# Patient Record
Sex: Female | Born: 1960 | Race: White | Hispanic: No | Marital: Married | Smoking: Former smoker
Health system: Southern US, Community
[De-identification: ages and names within clinical notes are randomized; demographics above are authoritative.]

## PROBLEM LIST (undated history)

## (undated) DIAGNOSIS — R519 Headache, unspecified: Secondary | ICD-10-CM

## (undated) DIAGNOSIS — I10 Essential (primary) hypertension: Secondary | ICD-10-CM

## (undated) DIAGNOSIS — E785 Hyperlipidemia, unspecified: Secondary | ICD-10-CM

## (undated) DIAGNOSIS — G250 Essential tremor: Secondary | ICD-10-CM

## (undated) DIAGNOSIS — F419 Anxiety disorder, unspecified: Secondary | ICD-10-CM

## (undated) DIAGNOSIS — R51 Headache: Secondary | ICD-10-CM

## (undated) HISTORY — PX: DILATION AND CURETTAGE OF UTERUS: SHX78

---

## 2004-10-09 ENCOUNTER — Ambulatory Visit: Payer: Self-pay | Admitting: Internal Medicine

## 2004-10-21 ENCOUNTER — Ambulatory Visit: Payer: Self-pay | Admitting: Internal Medicine

## 2005-10-29 ENCOUNTER — Ambulatory Visit: Payer: Self-pay | Admitting: Internal Medicine

## 2006-11-18 ENCOUNTER — Ambulatory Visit: Payer: Self-pay | Admitting: Internal Medicine

## 2007-01-14 ENCOUNTER — Ambulatory Visit: Payer: Self-pay | Admitting: Internal Medicine

## 2007-04-27 ENCOUNTER — Ambulatory Visit: Payer: Self-pay | Admitting: Internal Medicine

## 2007-10-20 ENCOUNTER — Ambulatory Visit: Payer: Self-pay | Admitting: Internal Medicine

## 2008-05-16 ENCOUNTER — Ambulatory Visit: Payer: Self-pay | Admitting: Family Medicine

## 2008-06-14 ENCOUNTER — Ambulatory Visit: Payer: Self-pay | Admitting: Internal Medicine

## 2008-07-31 ENCOUNTER — Ambulatory Visit: Payer: Self-pay | Admitting: Internal Medicine

## 2009-01-29 ENCOUNTER — Ambulatory Visit: Payer: Self-pay | Admitting: Internal Medicine

## 2009-08-06 ENCOUNTER — Ambulatory Visit: Payer: Self-pay | Admitting: Internal Medicine

## 2011-01-23 ENCOUNTER — Ambulatory Visit: Payer: Self-pay | Admitting: Internal Medicine

## 2011-10-21 ENCOUNTER — Ambulatory Visit: Payer: Self-pay | Admitting: Nurse Practitioner

## 2011-11-05 ENCOUNTER — Inpatient Hospital Stay: Payer: Self-pay | Admitting: Psychiatry

## 2011-11-05 LAB — URINALYSIS, COMPLETE
Glucose,UR: NEGATIVE mg/dL (ref 0–75)
Leukocyte Esterase: NEGATIVE
Nitrite: NEGATIVE
Ph: 7 (ref 4.5–8.0)
Protein: NEGATIVE
Specific Gravity: 1.003 (ref 1.003–1.030)

## 2011-11-05 LAB — COMPREHENSIVE METABOLIC PANEL
Albumin: 4.5 g/dL (ref 3.4–5.0)
Alkaline Phosphatase: 94 U/L (ref 50–136)
Anion Gap: 10 (ref 7–16)
Calcium, Total: 8.7 mg/dL (ref 8.5–10.1)
Glucose: 118 mg/dL — ABNORMAL HIGH (ref 65–99)
SGOT(AST): 19 U/L (ref 15–37)
SGPT (ALT): 24 U/L (ref 12–78)

## 2011-11-05 LAB — ETHANOL: Ethanol: <3 mg/dL

## 2011-11-05 LAB — DRUG SCREEN, URINE
Amphetamines, Ur Screen: NEGATIVE (ref ?–1000)
Barbiturates, Ur Screen: NEGATIVE (ref ?–200)
Benzodiazepine, Ur Scrn: NEGATIVE (ref ?–200)
Methadone, Ur Screen: NEGATIVE (ref ?–300)
Tricyclic, Ur Screen: NEGATIVE (ref ?–1000)

## 2011-11-05 LAB — CBC
HGB: 16 g/dL (ref 12.0–16.0)
MCH: 32.5 pg (ref 26.0–34.0)
MCHC: 34.5 g/dL (ref 32.0–36.0)
MCV: 94 fL (ref 80–100)

## 2011-11-05 LAB — TSH: Thyroid Stimulating Horm: 2.09 u[IU]/mL

## 2013-05-06 ENCOUNTER — Ambulatory Visit: Payer: Self-pay | Admitting: Family Medicine

## 2013-07-05 ENCOUNTER — Ambulatory Visit: Payer: Self-pay | Admitting: Obstetrics and Gynecology

## 2013-07-05 LAB — COMPREHENSIVE METABOLIC PANEL
ALK PHOS: 122 U/L — AB
ALT: 26 U/L (ref 12–78)
Albumin: 4.1 g/dL (ref 3.4–5.0)
Anion Gap: 6 — ABNORMAL LOW (ref 7–16)
BILIRUBIN TOTAL: 0.6 mg/dL (ref 0.2–1.0)
BUN: 11 mg/dL (ref 7–18)
CALCIUM: 9.1 mg/dL (ref 8.5–10.1)
CO2: 31 mmol/L (ref 21–32)
CREATININE: 0.92 mg/dL (ref 0.60–1.30)
Chloride: 102 mmol/L (ref 98–107)
EGFR (African American): >60
EGFR (Non-African Amer.): >60
Glucose: 100 mg/dL — ABNORMAL HIGH (ref 65–99)
OSMOLALITY: 277 (ref 275–301)
Potassium: 4.6 mmol/L (ref 3.5–5.1)
SGOT(AST): 16 U/L (ref 15–37)
Sodium: 139 mmol/L (ref 136–145)
Total Protein: 7.4 g/dL (ref 6.4–8.2)

## 2013-07-05 LAB — CBC
HCT: 45.2 % (ref 35.0–47.0)
HGB: 15.3 g/dL (ref 12.0–16.0)
MCH: 32.1 pg (ref 26.0–34.0)
MCHC: 33.9 g/dL (ref 32.0–36.0)
MCV: 95 fL (ref 80–100)
PLATELETS: 216 10*3/uL (ref 150–440)
RBC: 4.78 10*6/uL (ref 3.80–5.20)
RDW: 14.1 % (ref 11.5–14.5)
WBC: 8.6 10*3/uL (ref 3.6–11.0)

## 2013-07-14 ENCOUNTER — Ambulatory Visit: Payer: Self-pay | Admitting: Obstetrics and Gynecology

## 2013-07-15 LAB — PATHOLOGY REPORT

## 2014-04-25 NOTE — H&P (Signed)
PATIENT NAME:  Whitney Huffman, Whitney Huffman MR#:  [id number] DATE OF BIRTH:  Mar 18, 1960  DATE OF ADMISSION:  11/05/2011  REFERRING PHYSICIAN: Conni Slipper, MD    ADMITTING PHYSICIAN: Cephus Shelling, MD   REASON FOR ADMISSION: Severe anxiety and panic disorder, not feeling safe.   IDENTIFYING INFORMATION: Ms. Whitney Huffman is a 54 year old married Caucasian female currently living in the [place] area with her husband of 20 years and two children. 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   HISTORY OF PRESENT ILLNESS: Ms. Whitney Huffman is a 54 year old married Caucasian female with a history of recurrent depression and panic disorder for the past 12 years that first started after her dad passed away. She was referred to the Emergency Room by her PCP, Dr. Clayborn Bigness, secondary to the fact that the patient was experiencing worsening problems with anxiety and panic attacks and not feeling safe to return home. The patient says that about four weeks ago she had a really bad panic attack after she dropped her daughter off at the dentist. She cannot state exactly what precipitated the attack but says that since that time she has been having 6 to 8 small panic attacks lasting 2 to 3 minutes each time on an almost daily basis. She says the panic attacks have gotten so severe that she has not been able to function at work and has difficulty with shortness of breath, heart palpitations and feeling like there is something on her chest. She says she has a lot of fear between the attacks as well. The patient has been on Celexa 60 mg for the past 12 years but four weeks ago due to increasing anxiety and feeling like the Celexa was not working she was titrated off of Celexa and started on Effexor. She is currently taking 75 mg of Effexor but that was only increased from 37.5 mg just two days ago. The patient says that she does not feel like she has had any improvement on the Effexor and feels jittery and nervous all  the time. She cannot identify any specific triggers for the worsening anxiety or for the panic attacks. She says only thinking about future panic attacks is what brings them on. She says her husband's job is a little stressful currently but she does not think that is contributing. She does endorse feelings of hopelessness and helplessness, frequent crying spells, difficulty with focus and concentration, low energy level, and decreased appetite. She has lost 10 pounds within the past one month. She denies any insomnia but says that when she wakes up in the morning she is in a full-blown panic. She denies any auditory or visual hallucinations. No paranoid thoughts or delusions. The patient says that during the day she is pacing the floors a lot due to restlessness, anxiety, and feels like she has "bad thoughts" that another attack is going to occur. When she came to the hospital today accompanied by her husband, the patient says that she does not feel comfortable going home until her anxiety had decreased. In addition, she has also been having difficulty with elevated blood pressures with a systolic pressure of 102 and diastolic pressure of 725 when she was in the Emergency Room. She was started on an antihypertensive about two weeks ago secondary to elevated blood pressures at home. The patient denies any auditory or visual hallucinations. She denies any paranoid thoughts or delusions. She denies any history of symptoms consistent with bipolar mania including grandiose delusions, decreased sleep for several  days at a time with increased goal directed behavior, increased energy, hyperreligious thoughts, or hypersexual behavior.   PAST PSYCHIATRIC HISTORY: The patient denies any prior inpatient psychiatric hospitalizations or suicide attempts. The patient was seen by Dr. Thurmond Butts twice as an outpatient 12 years ago after her father passed. She has not been seeing a psychiatrist and has been getting her antidepressants  from her PCP since. She has had a trial of Prozac in the past and Celexa 60 mg which she was on for 12 years. She was recently given a Xanax prescription of 0.25 mg at bedtime by her PCP but has not had any improvement on the medication.   SUBSTANCE ABUSE HISTORY: The patient denies any history of any alcohol, cocaine, cannabis, opiate, or stimulant use. She does smoke a half to 1 pack of cigarettes per day and has been smoking since her teens.   FAMILY PSYCHIATRIC HISTORY: The patient denies any history of any mental illness or substance use in the family.   PAST MEDICAL HISTORY:  1. Hypertension.  2. Hyperlipidemia.  3. History of tonsillectomy.  4. She denies any history of any prior TBI or seizures.   OUTPATIENT MEDICATIONS:  1. Toprol-XL 25 mg p.o. daily. 2. Effexor XR 75 mg p.o. daily. 3. Xanax 0.25 mg p.o. nightly.   ALLERGIES: No known drug allergies.   SOCIAL HISTORY: The patient was born and raised in [place] by both her biological parents until her father passed away. She does report having a good relationship with her mother and her mother is still living in the area. She denies any history of any physical or sexual abuse. She has a 12th grade education and never went to college. 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  LIVING SITUATION: She currently lives with her husband and two children, a 60 year old and a 83 year old. The 52 year old apparently just moved back into the house for a few days and is planning to move into a house in their neighborhood. The 69 year old daughter is in college in [place]. She denies any significant marital conflict.   LEGAL HISTORY: She denies any history of any arrests or incarcerations.   MENTAL STATUS EXAM: Whitney Huffman is a 54 year old obese Caucasian female who was wearing burgundy scrub pants and a lime green shirt. She was fully alert and oriented to time, place, and situation. Speech was regular rate  and rhythm, fluent and coherent. Mood was described as being depressed and affect was extremely anxious. The patient was sad and tearful. She was shaking in all four extremities and her teeth were chattering. Her husband was at the bedside. She denied any current suicidal or homicidal thoughts. She denied any current auditory or visual hallucinations. She denied any paranoid thoughts or delusions. Judgment and insight appeared to be fairly good. Attention and concentration were fairly good. Recall was 3 out of 3 initially and 3 out of 3 after five minutes. The patient was able to name the presidents backwards to Rayne, Sr. She had difficulty with serial sevens after 93. She was able to spell world backwards correctly. Abstraction was good.   SUICIDE RISK ASSESSMENT: At this time Whitney Huffman remains at a fairly low to moderate risk of harm to self and others but risk will be elevated if the patient continues to experience severe anxiety and is not able to function at home. She denies any access to guns.   REVIEW OF SYSTEMS: CONSTITUTIONAL: She denies any weakness. Does complain of some  fatigue and decreased appetite with a 10 pound weight loss in the past one month. She denies any fever, chills, or night sweats. HEAD: She does complain of a headache currently. She denies any dizziness. EYES: She denies any diplopia or blurred vision. ENT: She denies any hearing loss, neck pain, or throat pain. RESPIRATORY: She denies any shortness breath or cough. CARDIOVASCULAR: She denies any chest pain or orthopnea. GI: She denies any nausea, vomiting, or abdominal pain. She denies any change in bowel movements. GU: She denies incontinence or problems with frequency of urine. ENDOCRINE: She denies any heat or cold intolerance. LYMPHATIC: She denies anemia or easy bruising. MUSCULOSKELETAL: She denies any muscle or joint pain. NEUROLOGICAL: She denies any tingling or weakness. PSYCHIATRIC: Please see history of present illness.    PHYSICAL EXAMINATION:   VITAL SIGNS: Blood pressure 215/108, pulse 95, respirations 20, temperature 97.8.   HEENT: Normocephalic, atraumatic. Pupils equal, round, and reactive to light and accommodation. Extraocular movements intact. Oral mucosa was moist. No lesions noted.   NECK: Supple. No cervical lymphadenopathy or thyromegaly present.   LUNGS: Clear to auscultation bilaterally. No crackles, rales, or rhonchi.   CARDIAC: S1, S2, present. Regular rate and rhythm. No murmurs, rubs, or gallops.   ABDOMEN: Soft and normoactive bowel sounds present in all four quadrants. No masses noted. No tenderness noted. No distention.   EXTREMITIES: +2 pedal pulses bilaterally. No rashes, clubbing, or edema.   NEUROLOGIC: Cranial nerves II through XII grossly intact. Gait was normal and steady. Sensation intact.   LABORATORY, DIAGNOSTIC, AND RADIOLOGICAL DATA: Urine tox screen was negative for all substances. LFTs, TSH, BMP, and CBC were all within normal limits. Urinalysis is nitrite and leukocyte esterase negative with 1 WBC. Salicylates were 3.9. Acetaminophen level was less than 2. Glucose 118.   DIAGNOSES:  AXIS I:  1. Panic disorder. 2. Major depression, recurrent, moderate to severe.   AXIS II: Deferred.   AXIS III:  1. Hypertension.  2. Hyperlipidemia.   AXIS IV: Mild. Husband's occupational problems.   AXIS V: GAF at present equals 30.   ASSESSMENT AND TREATMENT RECOMMENDATIONS: Whitney Huffman is a 54 year old married Caucasian female with history of panic disorder and recurrent depression who was referred to the Emergency Room by her PCP due to the fact that she is unable to function in the community and has not been able to work secondary to severe anxiety and panic attacks. The patient is complaining of a lot of stress and feeling unsafe returning home due to anxiety and panic attacks. She is denying any suicidal thoughts but says that she does not know what she will do if she does  not get help. She denies any psychotic symptoms. Will admit to Inpatient Psychiatry for medication management, safety, and stabilization and place on close observation.  1. Panic disorder/major depressive disorder, recurrent. Will plan to increase Effexor-XR to 150 mg p.o. daily and add Lexapro 10 mg p.o. daily as well for anxiety and panic attacks. The patient did have a positive response to Celexa in the past but felt like it stopped working. Will check B12 level.  2. Hypertension. The patient has quite an elevated blood pressure in the Emergency Room at 215/108 but was given clonidine by the ER physician. Will restart metoprolol XL at 25 mg p.o. daily and increase antihypertensive if needed. Elevated blood pressure may be secondary to anxiety. The patient was also started on Klonopin 1 mg p.o. b.i.d. for severe anxiety and panic attacks.  3.  Disposition. The patient has a stable living situation. Will need to find mental health follow-up with a community provider. Risks, benefits, and alternatives to treatment were discussed with the patient and she consented to the treatment plan.   ____________________________ Steva Colder. Nicolasa Ducking, MD akk:drc D: 11/05/2011 13:53:16 ET T: 11/05/2011 14:15:57 ET JOB#: [id number]  cc: Gedalia Mcmillon K. Nicolasa Ducking, MD, <Dictator> Chauncey Mann MD ELECTRONICALLY SIGNED 11/06/2011 14:41

## 2014-04-29 NOTE — Op Note (Signed)
PATIENT NAME:  Whitney Huffman, Whitney Huffman MR#:  [id number] DATE OF BIRTH:  10-25-1960  DATE OF PROCEDURE:  07/14/2013  PREOPERATIVE DIAGNOSIS: Postmenopausal bleeding.  POSTOPERATIVE DIAGNOSIS: Postmenopausal bleeding.   PROCEDURES:   1.  Dilation and curettage.  2.  Hysteroscopy.   ANESTHESIA: General.   SURGEON: Prentice Docker, M.D.   ESTIMATED BLOOD LOSS: Minimal.   OPERATIVE FLUIDS: 500 mL crystalloid.   COMPLICATIONS: None.   FINDINGS: Normal-appearing uterine cavity without obvious lesions.   SPECIMENS: Endometrial curettings.   CONDITION AT THE END OF PROCEDURE: Stable.   PROCEDURE IN DETAIL: The patient was taken to the operating room where general anesthesia was administered and found to be adequate. She was placed in the dorsal supine high lithotomy position in candy cane stirrups and prepped and draped in the usual sterile fashion. After a timeout was called her bladder was emptied using straight catheterization. A sterile speculum was placed in the vagina and a single-tooth tenaculum was used to grasp the anterior lip of the cervix. The uterus was sounded to a depth of approximately 8 cm. The cervix was dilated gently in a serial fashion using Hegar dilators to a dilatation of 6 mm. MyoSure  hysteroscope was gently advanced through the cervix into the uterine cavity with the above-noted findings. With no obvious lesions needing to be removed with the MyoSure device, the hysteroscope was removed and a gentle curettage was performed for global sample throughout. At the end of the curettage the scope was reintroduced into the cervix to assure global sampling and hemostasis, both of which were noted.   The hysteroscope was removed and the single-tooth tenaculum was removed from the cervix with hemostasis noted. No other instrumentation was remaining in the vagina at this time as the speculum was being removed.   The patient tolerated the procedure well. Sponge, lap, and needle counts were  correct x2. For VTE prophylaxis, the patient was wearing pneumatic compression stockings which were on and operating throughout the entire procedure.    ____________________________ Will Bonnet, MD sdj:lt D: 07/14/2013 10:31:00 ET T: 07/14/2013 10:59:13 ET JOB#: [id number]  cc: Will Bonnet, MD, <Dictator> Will Bonnet MD ELECTRONICALLY SIGNED 07/27/2013 2:49

## 2015-02-26 ENCOUNTER — Ambulatory Visit
Admission: RE | Admit: 2015-02-26 | Discharge: 2015-02-26 | Disposition: A | Discharge status: Home / Self Care | Payer: 59 | Source: Ambulatory Visit | Attending: Family Medicine | Admitting: Family Medicine

## 2015-02-26 ENCOUNTER — Other Ambulatory Visit: Payer: Self-pay | Admitting: Family Medicine

## 2015-02-26 DIAGNOSIS — Z1231 Encounter for screening mammogram for malignant neoplasm of breast: Secondary | ICD-10-CM | POA: Insufficient documentation

## 2015-05-10 ENCOUNTER — Encounter: Payer: Self-pay | Admitting: *Deleted

## 2015-05-11 ENCOUNTER — Ambulatory Visit: Payer: 59 | Admitting: Anesthesiology

## 2015-05-11 ENCOUNTER — Encounter: Payer: Self-pay | Admitting: *Deleted

## 2015-05-11 ENCOUNTER — Encounter
Admission: RE | Disposition: A | Discharge status: Home / Self Care | Payer: Self-pay | Source: Ambulatory Visit | Attending: Unknown Physician Specialty

## 2015-05-11 ENCOUNTER — Ambulatory Visit
Admission: RE | Admit: 2015-05-11 | Discharge: 2015-05-11 | Disposition: A | Discharge status: Home / Self Care | Payer: 59 | Source: Ambulatory Visit | Attending: Unknown Physician Specialty | Admitting: Unknown Physician Specialty

## 2015-05-11 DIAGNOSIS — E785 Hyperlipidemia, unspecified: Secondary | ICD-10-CM | POA: Diagnosis not present

## 2015-05-11 DIAGNOSIS — K635 Polyp of colon: Secondary | ICD-10-CM | POA: Insufficient documentation

## 2015-05-11 DIAGNOSIS — D122 Benign neoplasm of ascending colon: Secondary | ICD-10-CM | POA: Insufficient documentation

## 2015-05-11 DIAGNOSIS — G250 Essential tremor: Secondary | ICD-10-CM | POA: Insufficient documentation

## 2015-05-11 DIAGNOSIS — D123 Benign neoplasm of transverse colon: Secondary | ICD-10-CM | POA: Insufficient documentation

## 2015-05-11 DIAGNOSIS — I10 Essential (primary) hypertension: Secondary | ICD-10-CM | POA: Diagnosis not present

## 2015-05-11 DIAGNOSIS — Z79899 Other long term (current) drug therapy: Secondary | ICD-10-CM | POA: Insufficient documentation

## 2015-05-11 DIAGNOSIS — Z803 Family history of malignant neoplasm of breast: Secondary | ICD-10-CM | POA: Diagnosis not present

## 2015-05-11 DIAGNOSIS — F419 Anxiety disorder, unspecified: Secondary | ICD-10-CM | POA: Diagnosis not present

## 2015-05-11 DIAGNOSIS — D125 Benign neoplasm of sigmoid colon: Secondary | ICD-10-CM | POA: Insufficient documentation

## 2015-05-11 DIAGNOSIS — Z9889 Other specified postprocedural states: Secondary | ICD-10-CM | POA: Diagnosis not present

## 2015-05-11 DIAGNOSIS — Z87891 Personal history of nicotine dependence: Secondary | ICD-10-CM | POA: Diagnosis not present

## 2015-05-11 DIAGNOSIS — Z1211 Encounter for screening for malignant neoplasm of colon: Secondary | ICD-10-CM | POA: Insufficient documentation

## 2015-05-11 DIAGNOSIS — D124 Benign neoplasm of descending colon: Secondary | ICD-10-CM | POA: Insufficient documentation

## 2015-05-11 HISTORY — PX: COLONOSCOPY WITH PROPOFOL: SHX5780

## 2015-05-11 HISTORY — DX: Anxiety disorder, unspecified: F41.9

## 2015-05-11 HISTORY — DX: Essential tremor: G25.0

## 2015-05-11 HISTORY — DX: Hyperlipidemia, unspecified: E78.5

## 2015-05-11 HISTORY — DX: Headache: R51

## 2015-05-11 HISTORY — DX: Essential (primary) hypertension: I10

## 2015-05-11 HISTORY — DX: Headache, unspecified: R51.9

## 2015-05-11 SURGERY — COLONOSCOPY WITH PROPOFOL
Anesthesia: General

## 2015-05-11 MED ORDER — LIDOCAINE HCL (PF) 2 % IJ SOLN
INTRAMUSCULAR | Status: DC | PRN
  Administered 2015-05-11: 50 mg

## 2015-05-11 MED ORDER — PROPOFOL 500 MG/50ML IV EMUL
INTRAVENOUS | Status: DC | PRN
Start: 2015-05-11 — End: 2015-05-11
  Administered 2015-05-11: 50 ug/kg/min via INTRAVENOUS

## 2015-05-11 MED ORDER — PROPOFOL 10 MG/ML IV BOLUS
INTRAVENOUS | Status: DC | PRN
  Administered 2015-05-11: 30 mg via INTRAVENOUS
  Administered 2015-05-11: 20 mg via INTRAVENOUS

## 2015-05-11 MED ORDER — FENTANYL CITRATE (PF) 100 MCG/2ML IJ SOLN
INTRAMUSCULAR | Status: DC | PRN
  Administered 2015-05-11 (×2): 50 ug via INTRAVENOUS

## 2015-05-11 MED ORDER — SODIUM CHLORIDE 0.9 % IV SOLN
INTRAVENOUS | Status: DC
  Administered 2015-05-11: 1000 mL via INTRAVENOUS

## 2015-05-11 MED ORDER — SODIUM CHLORIDE 0.9 % IV SOLN: INTRAVENOUS | Status: DC

## 2015-05-11 MED ORDER — MIDAZOLAM HCL 5 MG/5ML IJ SOLN
INTRAMUSCULAR | Status: DC | PRN
  Administered 2015-05-11 (×2): 1 mg via INTRAVENOUS

## 2015-05-11 NOTE — Transfer of Care (Signed)
Immediate Anesthesia Transfer of Care Note  Patient: Whitney Huffman  Procedure(s) Performed: Procedure(s): COLONOSCOPY WITH PROPOFOL (N/A)  Patient Location: PACU  Anesthesia Type:General  Level of Consciousness: awake, alert , oriented and patient cooperative  Airway & Oxygen Therapy: Patient Spontanous Breathing and Patient connected to nasal cannula oxygen  Post-op Assessment: Report given to RN, Post -op Vital signs reviewed and stable and Patient moving all extremities X 4  Post vital signs: Reviewed and stable  Last Vitals:  Filed Vitals:   05/11/15 1338  BP: 185/82  Pulse: 93  Temp: 36.7 C  Resp: 18    Last Pain: There were no vitals filed for this visit.       Complications: No apparent anesthesia complications

## 2015-05-11 NOTE — Anesthesia Preprocedure Evaluation (Signed)
Anesthesia Evaluation  Patient identified by MRN, date of birth, ID band Patient awake    Reviewed: Allergy & Precautions, H&P , NPO status , Patient's Chart, lab work & pertinent test results  History of Anesthesia Complications Negative for: history of anesthetic complications  Airway Mallampati: III  TM Distance: >3 FB Neck ROM: full    Dental  (+) Poor Dentition   Pulmonary neg shortness of breath, former smoker,    Pulmonary exam normal breath sounds clear to auscultation       Cardiovascular Exercise Tolerance: Good hypertension, (-) angina(-) Past MI and (-) DOE negative cardio ROS Normal cardiovascular exam Rhythm:regular Rate:Normal     Neuro/Psych  Headaches, PSYCHIATRIC DISORDERS Anxiety    GI/Hepatic negative GI ROS, Neg liver ROS, neg GERD  ,  Endo/Other  negative endocrine ROS  Renal/GU negative Renal ROS  negative genitourinary   Musculoskeletal   Abdominal   Peds  Hematology negative hematology ROS (+)   Anesthesia Other Findings Past Medical History:   Anxiety                                                      Hypertension                                                 Essential tremor                                             Headache                                                     Hyperlipidemia                                              Past Surgical History:   DILATION AND CURETTAGE OF UTERUS                             BMI    Body Mass Index   42.80 kg/m 2    Signs and symptoms suggestive of sleep apnea    Reproductive/Obstetrics negative OB ROS                             Anesthesia Physical Anesthesia Plan  ASA: III  Anesthesia Plan: General   Post-op Pain Management:    Induction:   Airway Management Planned:   Additional Equipment:   Intra-op Plan:   Post-operative Plan:   Informed Consent: I have reviewed the patients History  and Physical, chart, labs and discussed the procedure including the risks, benefits and alternatives for the proposed anesthesia with the patient or authorized representative who has indicated his/her understanding and acceptance.   Dental Advisory  Given  Plan Discussed with: Anesthesiologist, CRNA and Surgeon  Anesthesia Plan Comments:         Anesthesia Quick Evaluation

## 2015-05-11 NOTE — Op Note (Signed)
[facility] Gastroenterology Patient Name: Whitney Huffman Procedure Date: 05/11/2015 1:56 PM MRN: [id number] Account #: [id number] Date of Birth: Jan 24, 1960 Admit Type: Outpatient Age: 55 Room: [facility] ENDO ROOM 1 Gender: Female Note Status: Finalized Procedure:            Colonoscopy Indications:          Screening for colorectal malignant neoplasm Providers:            Manya Silvas, MD Referring MD:         Dani Gobble. White, MD (Referring MD) Medicines:            Propofol per Anesthesia Complications:        No immediate complications. Procedure:            Pre-Anesthesia Assessment:                       - After reviewing the risks and benefits, the patient                        was deemed in satisfactory condition to undergo the                        procedure.                       After obtaining informed consent, the colonoscope was                        passed under direct vision. Throughout the procedure,                        the patient's blood pressure, pulse, and oxygen                        saturations were monitored continuously. The                        Colonoscope was introduced through the anus and                        advanced to the the cecum, identified by appendiceal                        orifice and ileocecal valve. The colonoscopy was                        somewhat difficult. The patient tolerated the procedure                        well. Findings:      Three sessile polyps were found in the ascending colon. The polyps were       small in size. These polyps were removed with a hot snare. Resection and       retrieval were complete.      Four sessile polyps were found in the transverse colon. The polyps were       small in size. These polyps were removed with a hot snare. Resection and       retrieval were complete.      Many sessile polyps were found in the sigmoid colon and descending       colon.  The polyps were small in  size.      A 15 mm polyp was found in the distal sigmoid colon. The polyp was       sessile. The polyp was removed with a hot snare. Resection and retrieval       were complete. Colon tissue which may be the stalk or adenomatous       extension was biopsied. Impression:           - Three small polyps in the ascending colon, removed                        with a hot snare. Resected and retrieved.                       - Four small polyps in the transverse colon, removed                        with a hot snare. Resected and retrieved.                       - Many small polyps in the sigmoid colon and in the                        descending colon.                       - One 15 mm polyp in the distal sigmoid colon, removed                        with a hot snare. Resected and retrieved. Recommendation:       - Await pathology results. Manya Silvas, MD 05/11/2015 3:15:22 PM This report has been signed electronically. Number of Addenda: 0 Note Initiated On: 05/11/2015 1:56 PM Scope Withdrawal Time: 0 hours 42 minutes 51 seconds  Total Procedure Duration: 0 hours 54 minutes 40 seconds       [facility]

## 2015-05-11 NOTE — H&P (Signed)
   Primary Care Physician:  WHITE, Orlene Och, NP Primary Gastroenterologist:  Dr. Vira Agar  Pre-Procedure History & Physical: HPI:  Whitney Huffman is a 55 y.o. female is here for an colonoscopy.   Past Medical History  Diagnosis Date  . Anxiety   . Hypertension   . Essential tremor   . Headache   . Hyperlipidemia     Past Surgical History  Procedure Laterality Date  . Dilation and curettage of uterus      Prior to Admission medications   Medication Sig Start Date End Date Taking? Authorizing Provider  busPIRone (BUSPAR) 15 MG tablet Take 15 mg by mouth 2 (two) times daily.   Yes Historical Provider, MD  DULoxetine (CYMBALTA) 60 MG capsule Take 60 mg by mouth daily.   Yes Historical Provider, MD  lisinopril-hydrochlorothiazide (PRINZIDE,ZESTORETIC) 20-12.5 MG tablet Take 1 tablet by mouth daily.   Yes Historical Provider, MD    Allergies as of 05/02/2015  . (Not on File)    Family History  Problem Relation Age of Onset  . Breast cancer Maternal Grandmother 70    Social History   Social History  . Marital Status: Married    Spouse Name: N/A  . Number of Children: N/A  . Years of Education: N/A   Occupational History  . Not on file.   Social History Main Topics  . Smoking status: Former Smoker    Types: Cigarettes    Quit date: 05/10/2013  . Smokeless tobacco: Never Used  . Alcohol Use: No  . Drug Use: No  . Sexual Activity: Not on file   Other Topics Concern  . Not on file   Social History Narrative    Review of Systems: See HPI, otherwise negative ROS  Physical Exam: BP 185/82 mmHg  Pulse 93  Temp(Src) 98.1 F (36.7 C) (Tympanic)  Resp 18  Ht 5\' 9"  (1.753 m)  Wt 131.543 kg (290 lb)  BMI 42.81 kg/m2  SpO2 100% General:   Alert,  pleasant and cooperative in NAD Head:  Normocephalic and atraumatic. Neck:  Supple; no masses or thyromegaly. Lungs:  Clear throughout to auscultation.    Heart:  Regular rate and rhythm. Abdomen:  Soft,  nontender and nondistended. Normal bowel sounds, without guarding, and without rebound.   Neurologic:  Alert and  oriented x4;  grossly normal neurologically.  Impression/Plan: Whitney Huffman is here for an colonoscopy to be performed for screening colonoscopy  Risks, benefits, limitations, and alternatives regarding  colonoscopy have been reviewed with the patient.  Questions have been answered.  All parties agreeable.   Gaylyn Cheers, MD  05/11/2015, 1:58 PM

## 2015-05-13 ENCOUNTER — Encounter: Payer: Self-pay | Admitting: Unknown Physician Specialty

## 2015-05-14 NOTE — Anesthesia Postprocedure Evaluation (Signed)
Anesthesia Post Note  Patient: Whitney Huffman  Procedure(s) Performed: Procedure(s) (LRB): COLONOSCOPY WITH PROPOFOL (N/A)  Patient location during evaluation: Endoscopy Anesthesia Type: General Level of consciousness: awake and alert Pain management: pain level controlled Vital Signs Assessment: post-procedure vital signs reviewed and stable Respiratory status: spontaneous breathing, nonlabored ventilation, respiratory function stable and patient connected to nasal cannula oxygen Cardiovascular status: blood pressure returned to baseline and stable Postop Assessment: no signs of nausea or vomiting Anesthetic complications: no    Last Vitals:  Filed Vitals:   05/11/15 1538 05/11/15 1548  BP: 136/52 146/72  Pulse: 77 77  Temp:    Resp: 17 18    Last Pain:  Filed Vitals:   05/12/15 1432  PainSc: 0-No pain                 Martha Clan

## 2015-05-15 LAB — SURGICAL PATHOLOGY

## 2016-11-06 ENCOUNTER — Other Ambulatory Visit: Payer: Self-pay | Admitting: Family Medicine

## 2016-11-06 DIAGNOSIS — Z1231 Encounter for screening mammogram for malignant neoplasm of breast: Secondary | ICD-10-CM

## 2016-12-02 ENCOUNTER — Ambulatory Visit
Admission: RE | Admit: 2016-12-02 | Discharge: 2016-12-02 | Disposition: A | Discharge status: Home / Self Care | Payer: 59 | Source: Ambulatory Visit | Attending: Family Medicine | Admitting: Family Medicine

## 2016-12-02 DIAGNOSIS — Z1231 Encounter for screening mammogram for malignant neoplasm of breast: Secondary | ICD-10-CM | POA: Diagnosis not present

## 2018-02-05 ENCOUNTER — Other Ambulatory Visit: Payer: Self-pay | Admitting: Family Medicine

## 2018-02-05 DIAGNOSIS — Z1231 Encounter for screening mammogram for malignant neoplasm of breast: Secondary | ICD-10-CM

## 2018-02-23 ENCOUNTER — Encounter (INDEPENDENT_AMBULATORY_CARE_PROVIDER_SITE_OTHER): Payer: Self-pay

## 2018-02-23 ENCOUNTER — Ambulatory Visit
Admission: RE | Admit: 2018-02-23 | Discharge: 2018-02-23 | Disposition: A | Discharge status: Home / Self Care | Payer: 59 | Source: Ambulatory Visit | Attending: Family Medicine | Admitting: Family Medicine

## 2018-02-23 DIAGNOSIS — Z1231 Encounter for screening mammogram for malignant neoplasm of breast: Secondary | ICD-10-CM

## 2018-04-28 IMAGING — MG MM DIGITAL SCREENING BILAT W/ CAD
5 series · 5 of 5 positions shown · non-contrast
Comparison: Previous exam(s).

ACR Breast Density Category a: The breast tissue is almost entirely
fatty.

CLINICAL DATA: Screening.

EXAM:
DIGITAL SCREENING BILATERAL MAMMOGRAM WITH CAD

[L CC]
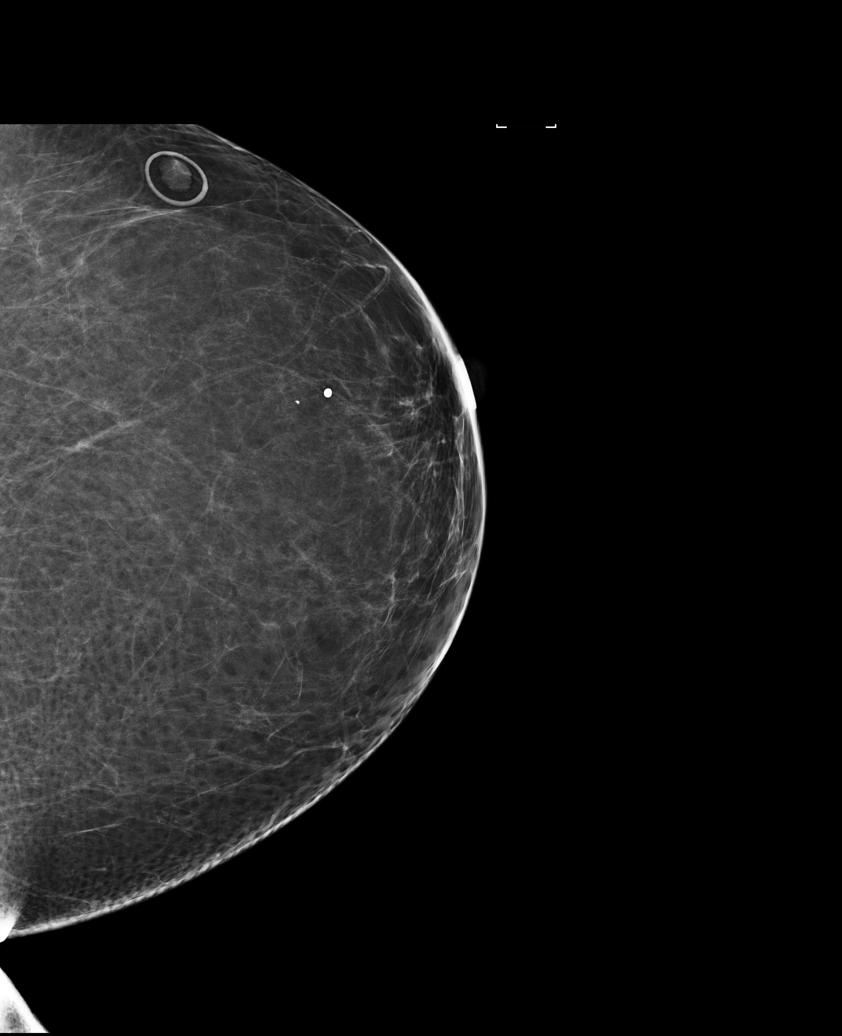

[R CC]
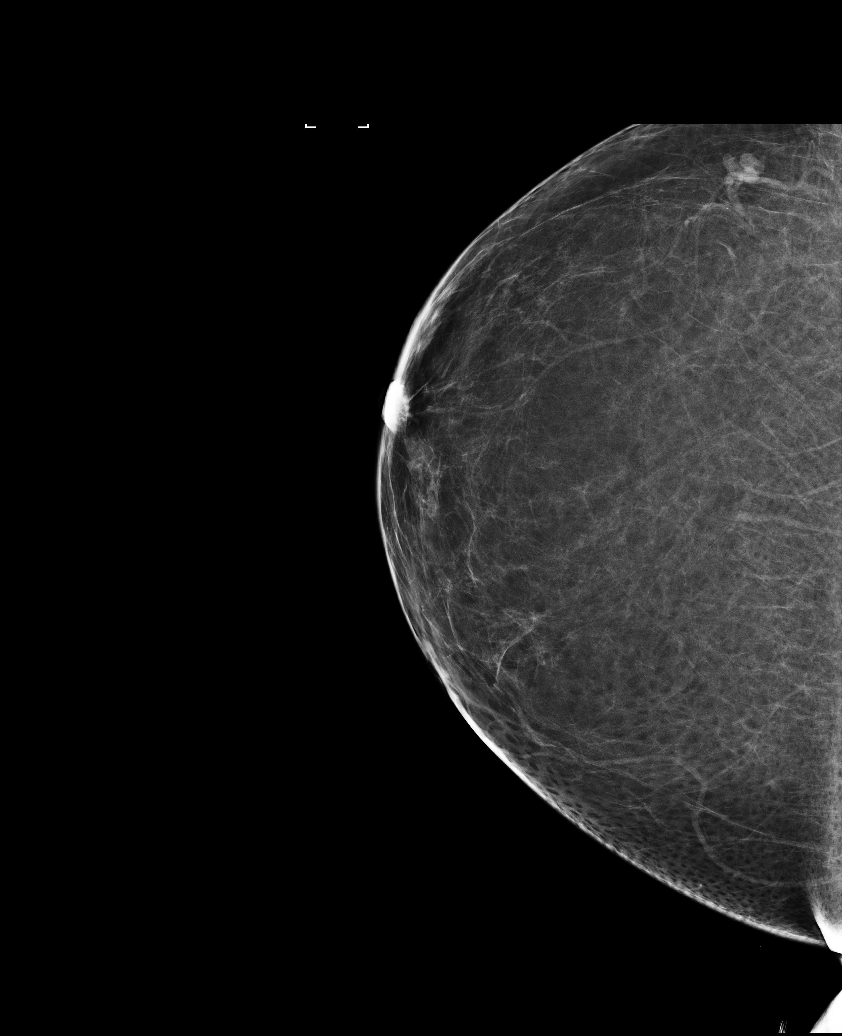

[L MLO (1 of 2)]
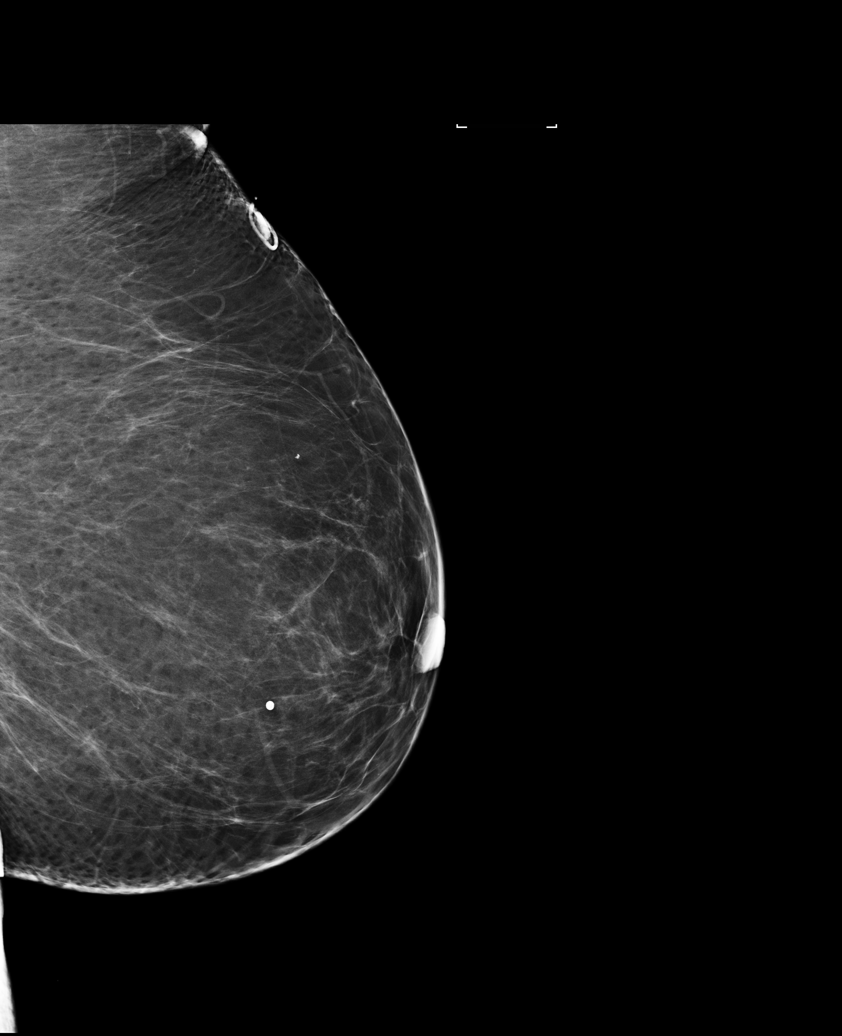

[R MLO]
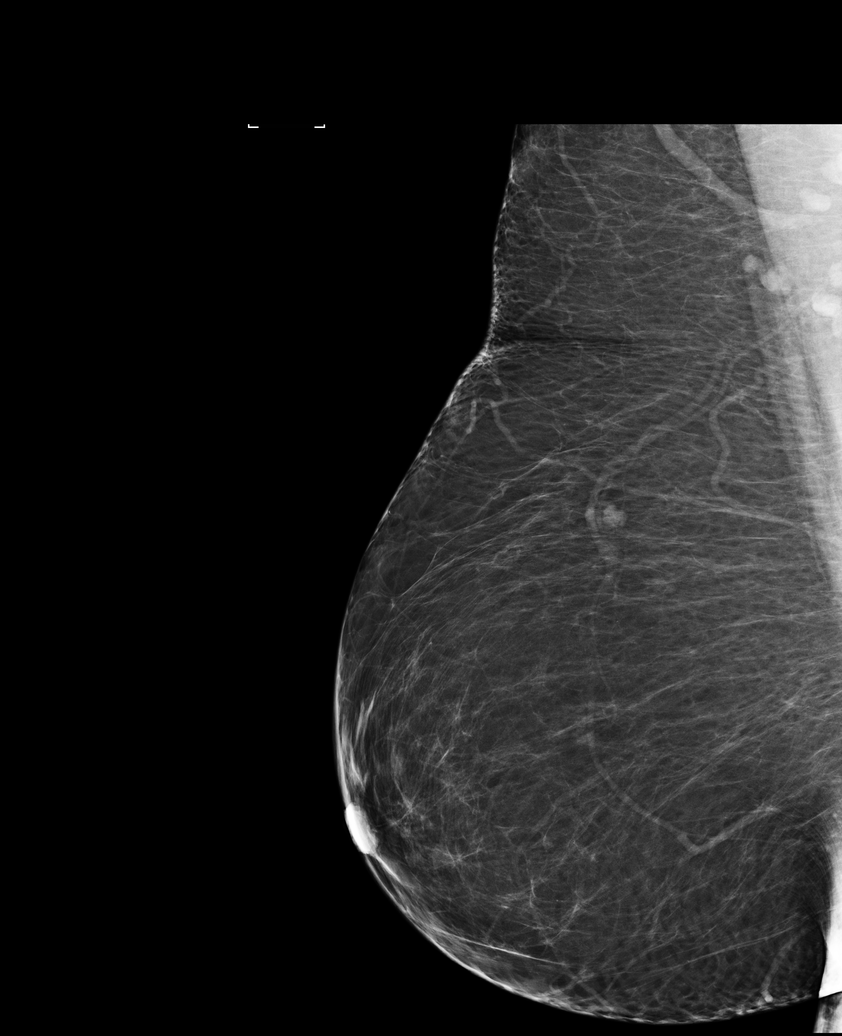

[L MLO (2 of 2)]
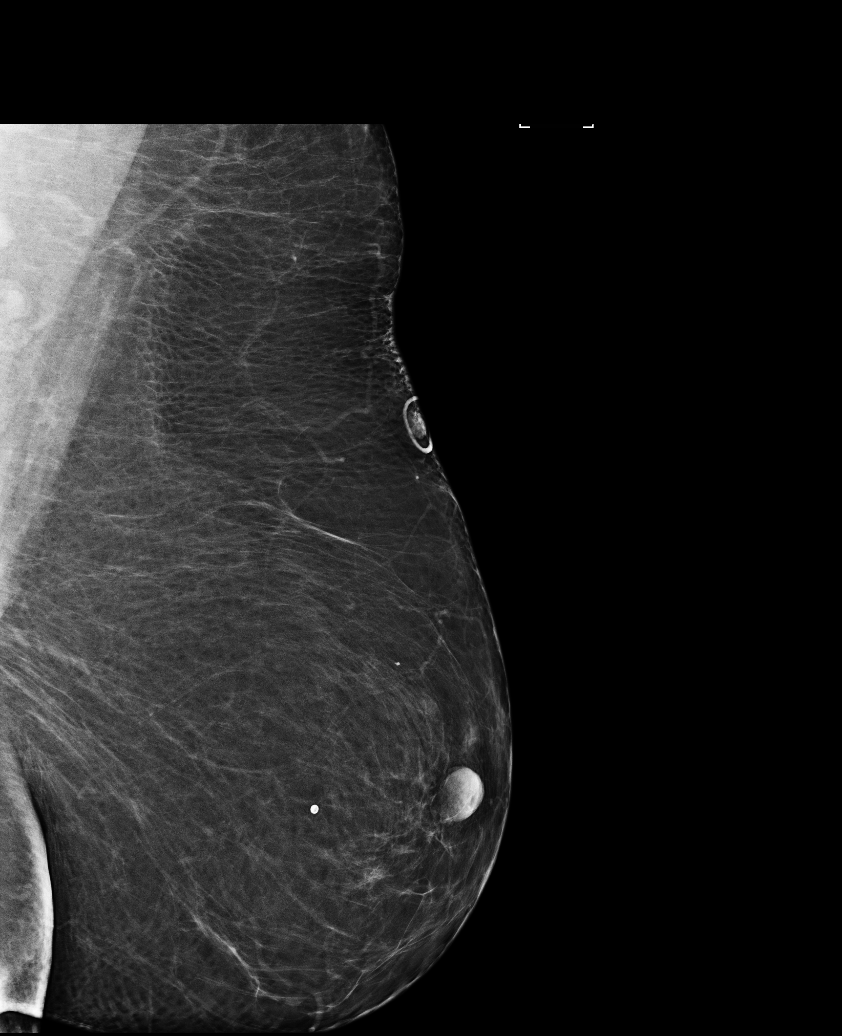

[5 of 5 positions shown; findings below may reference images not displayed]

FINDINGS: There are no findings suspicious for malignancy. Images were
processed with CAD.
IMPRESSION: No mammographic evidence of malignancy. A result letter of this
screening mammogram will be mailed directly to the patient.

RECOMMENDATION:
Screening mammogram in one year. (Code:[id number]-W-8NO)

BI-RADS CATEGORY  1: Negative.

## 2019-08-24 ENCOUNTER — Encounter: Payer: Self-pay | Admitting: Emergency Medicine

## 2019-08-24 ENCOUNTER — Other Ambulatory Visit: Payer: Self-pay

## 2019-08-24 ENCOUNTER — Emergency Department
Admission: EM | Admit: 2019-08-24 | Discharge: 2019-08-24 | Disposition: A | Discharge status: Home / Self Care | Payer: 59 | Attending: Emergency Medicine | Admitting: Emergency Medicine

## 2019-08-24 ENCOUNTER — Emergency Department: Payer: 59

## 2019-08-24 DIAGNOSIS — Z79899 Other long term (current) drug therapy: Secondary | ICD-10-CM | POA: Insufficient documentation

## 2019-08-24 DIAGNOSIS — Z87891 Personal history of nicotine dependence: Secondary | ICD-10-CM | POA: Diagnosis not present

## 2019-08-24 DIAGNOSIS — M25561 Pain in right knee: Secondary | ICD-10-CM | POA: Diagnosis present

## 2019-08-24 DIAGNOSIS — I10 Essential (primary) hypertension: Secondary | ICD-10-CM | POA: Insufficient documentation

## 2019-08-24 MED ORDER — ACETAMINOPHEN 325 MG PO TABS
650.0000 mg | ORAL_TABLET | Freq: Once | ORAL | Status: AC
  Administered 2019-08-24: 650 mg via ORAL
  Filled 2019-08-24: qty 2

## 2019-08-24 MED ORDER — LISINOPRIL 10 MG PO TABS
20.0000 mg | ORAL_TABLET | Freq: Every day | ORAL | Status: DC
  Administered 2019-08-24: 20 mg via ORAL
  Filled 2019-08-24: qty 2

## 2019-08-24 MED ORDER — LIDOCAINE 5 % EX PTCH
1.0000 | MEDICATED_PATCH | CUTANEOUS | Status: DC
  Administered 2019-08-24: 1 via TRANSDERMAL
  Filled 2019-08-24: qty 1

## 2019-08-24 MED ORDER — LISINOPRIL-HYDROCHLOROTHIAZIDE 20-12.5 MG PO TABS: 1.0000 | ORAL_TABLET | Freq: Once | ORAL | Status: DC

## 2019-08-24 MED ORDER — HYDROCHLOROTHIAZIDE 12.5 MG PO CAPS
12.5000 mg | ORAL_CAPSULE | Freq: Every day | ORAL | Status: DC
  Administered 2019-08-24: 12.5 mg via ORAL
  Filled 2019-08-24: qty 1

## 2019-08-24 NOTE — ED Notes (Signed)
See triage note   Presents with right knee pain  States pain started couple of days ago  Min swelling noted  Ambulates with limb

## 2019-08-24 NOTE — ED Triage Notes (Signed)
Pt reports was seen at [facility] and was told to come to the ED to have fluid removed from her right knee and an x-ray

## 2019-08-24 NOTE — ED Notes (Signed)
Pt declines crutches, states, "We have crutches in the family that I can get if I need them."

## 2019-08-24 NOTE — ED Notes (Signed)
Pt st "I have crutches at home". Refuses crutches at this time.

## 2019-08-24 NOTE — ED Provider Notes (Signed)
[facility] Emergency Department Provider Note  ____________________________________________   First MD Initiated Contact with Patient 08/24/19 1152     (approximate)  I have reviewed the triage vital signs and the nursing notes.   HISTORY  Chief Complaint Knee Pain   HPI Whitney Huffman is a 59 y.o. female with a past medical history of HTN, HDL, headaches, and essential tremor who presents for assessment of pain in the front left side of her right knee that began 4 to 5 days ago.  Patient does not recall any trauma or falls.  She states she went to urgent care and told her to come to the ED.  No prior similar episodes.  She states her pain is aggravated by palpation or bearing weight.  No prior similar episodes or history of gout.  No fevers, chills, cough, vomiting, diarrhea, dysuria, rash, pain in her right ankle or hip.  No other acute concerns at this time.         Past Medical History:  Diagnosis Date  . Anxiety   . Essential tremor   . Headache   . Hyperlipidemia   . Hypertension     There are no problems to display for this patient.   Past Surgical History:  Procedure Laterality Date  . COLONOSCOPY WITH PROPOFOL N/A 05/11/2015   Procedure: COLONOSCOPY WITH PROPOFOL;  Surgeon: Manya Silvas, MD;  Location: [facility] ENDOSCOPY;  Service: Endoscopy;  Laterality: N/A;  . DILATION AND CURETTAGE OF UTERUS      Prior to Admission medications   Medication Sig Start Date End Date Taking? Authorizing Provider  busPIRone (BUSPAR) 15 MG tablet Take 15 mg by mouth 2 (two) times daily.    [provider]  DULoxetine (CYMBALTA) 60 MG capsule Take 60 mg by mouth daily.    [provider]  lisinopril-hydrochlorothiazide (PRINZIDE,ZESTORETIC) 20-12.5 MG tablet Take 1 tablet by mouth daily.    [provider]    Allergies Patient has no known allergies.  Family History  Problem Relation Age of Onset  . Breast cancer Maternal  Grandmother 70    Social History Social History   Tobacco Use  . Smoking status: Former Smoker    Types: Cigarettes    Quit date: 05/10/2013    Years since quitting: 6.2  . Smokeless tobacco: Never Used  Substance Use Topics  . Alcohol use: No  . Drug use: No    Review of Systems  Review of Systems  Constitutional: Negative for chills and fever.  HENT: Negative for sore throat.   Eyes: Negative for pain.  Respiratory: Negative for cough and stridor.   Cardiovascular: Negative for chest pain.  Gastrointestinal: Negative for vomiting.  Musculoskeletal: Positive for joint pain ( R knee). Negative for back pain and falls.  Skin: Negative for rash.  Neurological: Negative for seizures, loss of consciousness and headaches.  Psychiatric/Behavioral: Negative for suicidal ideas.  All other systems reviewed and are negative.     ____________________________________________   PHYSICAL EXAM:  VITAL SIGNS: ED Triage Vitals  Enc Vitals Group     BP 08/24/19 1025 (!) 166/101     Pulse Rate 08/24/19 1025 (!) 108     Resp 08/24/19 1025 18     Temp 08/24/19 1025 97.7 F (36.5 C)     Temp Source 08/24/19 1025 Oral     SpO2 08/24/19 1025 97 %     Weight 08/24/19 1020 290 lb (131.5 kg)     Height 08/24/19  1020 5\' 9"  (1.753 m)     Head Circumference --      Peak Flow --      Pain Score 08/24/19 1020 9     Pain Loc --      Pain Edu? --      Excl. in [place]? --    Vitals:   08/24/19 1220 08/24/19 1228  BP: (!) 151/84   Pulse:  86  Resp:  17  Temp:    SpO2:  98%   Physical Exam Vitals and nursing note reviewed.  Constitutional:      General: She is not in acute distress.    Appearance: She is well-developed.  HENT:     Head: Normocephalic and atraumatic.     Right Ear: External ear normal.     Left Ear: External ear normal.     Nose: Nose normal.     Mouth/Throat:     Mouth: Mucous membranes are moist.  Eyes:     Conjunctiva/sclera: Conjunctivae normal.    Cardiovascular:     Rate and Rhythm: Normal rate and regular rhythm.     Heart sounds: No murmur heard.   Pulmonary:     Effort: Pulmonary effort is normal. No respiratory distress.     Breath sounds: Normal breath sounds.  Abdominal:     Palpations: Abdomen is soft.     Tenderness: There is no abdominal tenderness.  Musculoskeletal:     Cervical back: Neck supple.  Skin:    General: Skin is warm and dry.     Capillary Refill: Capillary refill takes less than 2 seconds.  Neurological:     Mental Status: She is alert and oriented to person, place, and time.  Psychiatric:        Mood and Affect: Mood normal.     2+ right-sided DP pulse.  Sensation intact to light touch throughout the lower extremity.  Patient has a small area of tenderness over the medial side of her right knee.  There is no effusion, formally, or tenderness over the anterior, lateral, or posterior aspects.  She has full range of motion on passive exam and has full strength.  There is no significant crepitus, edema, effusion, or other overlying skin changes.  Patient's right ankle is unremarkable. ____________________________________________   LABS (all labs ordered are listed, but only abnormal results are displayed)  Labs Reviewed - No data to display ____________________________________________  ____________________________________________  RADIOLOGY   Official radiology report(s): DG Knee Complete 4 Views Right  Result Date: 08/24/2019 CLINICAL DATA:  Pain and swelling EXAM: RIGHT KNEE - COMPLETE 4+ VIEW COMPARISON:  None. FINDINGS: Frontal, lateral, and bilateral oblique views were obtained. There is no fracture or dislocation. No joint effusion. There is mild narrowing medially and in the patellofemoral joint regions. No erosive change. IMPRESSION: Mild narrowing medially and in the patellofemoral joint regions. No fracture or dislocation. No evident joint effusion. Electronically Signed   By: Lowella Grip III M.D.   On: 08/24/2019 11:09    ____________________________________________   PROCEDURES  Procedure(s) performed (including Critical Care):  Procedures   ____________________________________________   INITIAL IMPRESSION / ASSESSMENT AND PLAN / ED COURSE        Patient presents for assessment of several days of nontraumatic right knee pain.  Patient is tachycardic and slightly hypertensive on arrival as above.  However on recheck her blood pressure had improved and her heart rate had decreased to 86.  Exam is not consistent with septic joint, large  effusion, gout, DVT, significant injury.  No distal neurovascular compromise.  X-ray does show findings that could be consistent with progression of arthritic disease although ligamentous injury is also within the differential given area of tenderness.  Patient was placed in a knee immobilizer.  She declined crutches.  Instructed to follow-up with orthopedic service.  Discharge stable condition.  Strict return precautions advised and discussed.   ____________________________________________   FINAL CLINICAL IMPRESSION(S) / ED DIAGNOSES  Final diagnoses:  Acute pain of right knee     ED Discharge Orders    None       Note:  This document was prepared using Dragon voice recognition software and may include unintentional dictation errors.   Lucrezia Starch, MD 08/24/19 1240

## 2019-12-05 ENCOUNTER — Other Ambulatory Visit: Payer: Self-pay | Admitting: Family Medicine

## 2019-12-05 DIAGNOSIS — Z1231 Encounter for screening mammogram for malignant neoplasm of breast: Secondary | ICD-10-CM

## 2020-01-12 ENCOUNTER — Inpatient Hospital Stay: Admission: RE | Admit: 2020-01-12 | Payer: 59 | Source: Ambulatory Visit

## 2021-12-12 ENCOUNTER — Other Ambulatory Visit: Payer: Self-pay | Admitting: Family Medicine

## 2021-12-12 DIAGNOSIS — Z1231 Encounter for screening mammogram for malignant neoplasm of breast: Secondary | ICD-10-CM

## 2022-01-29 ENCOUNTER — Ambulatory Visit
Admission: RE | Admit: 2022-01-29 | Discharge: 2022-01-29 | Disposition: A | Discharge status: Home / Self Care | Payer: 59 | Source: Ambulatory Visit | Attending: Family Medicine | Admitting: Family Medicine

## 2022-01-29 DIAGNOSIS — Z1231 Encounter for screening mammogram for malignant neoplasm of breast: Secondary | ICD-10-CM | POA: Insufficient documentation

## 2023-06-24 ENCOUNTER — Other Ambulatory Visit: Payer: Self-pay | Admitting: Family Medicine

## 2023-06-24 DIAGNOSIS — Z1231 Encounter for screening mammogram for malignant neoplasm of breast: Secondary | ICD-10-CM
